# Patient Record
Sex: Male | Born: 1955 | Race: Asian | Hispanic: No | Marital: Married | Smoking: Never smoker
Health system: Southern US, Community
[De-identification: ages and names within clinical notes are randomized; demographics above are authoritative.]

## PROBLEM LIST (undated history)

## (undated) DIAGNOSIS — L509 Urticaria, unspecified: Secondary | ICD-10-CM

## (undated) HISTORY — DX: Urticaria, unspecified: L50.9

---

## 2003-09-27 ENCOUNTER — Encounter: Admission: RE | Admit: 2003-09-27 | Discharge: 2003-12-26 | Payer: Self-pay | Admitting: Family Medicine

## 2004-01-17 ENCOUNTER — Encounter: Admission: RE | Admit: 2004-01-17 | Discharge: 2004-04-16 | Payer: Self-pay | Admitting: Family Medicine

## 2004-11-22 ENCOUNTER — Encounter: Admission: RE | Admit: 2004-11-22 | Discharge: 2004-11-22 | Payer: Self-pay | Admitting: Cardiovascular Disease

## 2010-04-01 ENCOUNTER — Encounter: Admission: RE | Admit: 2010-04-01 | Discharge: 2010-04-01 | Payer: Self-pay | Admitting: Occupational Medicine

## 2016-09-17 ENCOUNTER — Ambulatory Visit: Payer: Self-pay

## 2016-09-17 ENCOUNTER — Other Ambulatory Visit: Payer: Self-pay | Admitting: Occupational Medicine

## 2016-09-17 DIAGNOSIS — M79641 Pain in right hand: Secondary | ICD-10-CM

## 2017-03-12 ENCOUNTER — Other Ambulatory Visit: Payer: Self-pay | Admitting: Occupational Medicine

## 2017-03-12 ENCOUNTER — Ambulatory Visit (INDEPENDENT_AMBULATORY_CARE_PROVIDER_SITE_OTHER): Payer: 59 | Admitting: Allergy & Immunology

## 2017-03-12 ENCOUNTER — Ambulatory Visit: Payer: Self-pay

## 2017-03-12 ENCOUNTER — Encounter: Payer: Self-pay | Admitting: Allergy & Immunology

## 2017-03-12 VITALS — BP 128/66 | HR 64 | Temp 98.0°F | Resp 20 | Ht 65.5 in | Wt 218.4 lb

## 2017-03-12 DIAGNOSIS — J3089 Other allergic rhinitis: Secondary | ICD-10-CM

## 2017-03-12 DIAGNOSIS — L2084 Intrinsic (allergic) eczema: Secondary | ICD-10-CM | POA: Diagnosis not present

## 2017-03-12 DIAGNOSIS — M79641 Pain in right hand: Secondary | ICD-10-CM

## 2017-03-12 DIAGNOSIS — D229 Melanocytic nevi, unspecified: Secondary | ICD-10-CM | POA: Diagnosis not present

## 2017-03-12 MED ORDER — CLOBETASOL PROPIONATE 0.025 % EX CREA: 1.0000 "application " | TOPICAL_CREAM | Freq: Two times a day (BID) | CUTANEOUS | 5 refills | Status: DC | PRN

## 2017-03-12 MED ORDER — MOMETASONE FUROATE 0.1 % EX OINT: TOPICAL_OINTMENT | Freq: Two times a day (BID) | CUTANEOUS | 5 refills | Status: AC | PRN

## 2017-03-12 MED ORDER — FLUTICASONE PROPIONATE 50 MCG/ACT NA SUSP: 2.0000 | Freq: Every day | NASAL | 5 refills | Status: DC

## 2017-03-12 MED ORDER — CRISABOROLE 2 % EX OINT: 1.0000 "application " | TOPICAL_OINTMENT | Freq: Two times a day (BID) | CUTANEOUS | 5 refills | Status: DC | PRN

## 2017-03-12 MED ORDER — CETIRIZINE HCL 10 MG PO TABS: 10.0000 mg | ORAL_TABLET | Freq: Every day | ORAL | 5 refills | Status: DC

## 2017-03-12 MED ORDER — OLOPATADINE HCL 0.2 % OP SOLN: 1.0000 [drp] | OPHTHALMIC | 5 refills | Status: AC

## 2017-03-12 NOTE — Patient Instructions (Addendum)
1. Chronic rhinitis - Testing today showed: positive to [place] blue grass, meadow fescue grass, perennial rye grass, sweet vernal grass, timothy grass, common mugwort (a weed), red cedar (a tree), and dust mites. - Avoidance measures provided. - Start Zyrtec (cetirizine) 10mg  tablet once daily, Flonase (fluticasone) two sprays per nostril daily and Pataday (olopatadine) one drop per eye twice daily as needed - Consider allergy shots as a means of long-term control. - Allergy shots "re-train" the immune system to ignore environmental allergens and decrease the resulting immune response to those allergens.  - We can discuss more at the next appointment if the medications are not working for you.  2. Intrinsic atopic dermatitis - Continue with moisturizing twice daily. - Add clobetasol 0.025% cream twice daily as needed (do not use on the face and do not use longer than two weeks in a row). - Add mometasone 0.1% ointment twice daily as needed (do not use on the face) - Add Eucrisa 2% ointment twice daily (safe to use on the face and the rest of the body). - Consider adding a twice monthly injection in the future to control the eczema: Dupixent  3. Multiple nevi - Continue to follow up with your Dermatologist.  - You have multiple skin lesions that could develop into melanoma.  - Concerning features for melanoma include the ABCDEs: asymmetry (one half is unlike the other), borders (irregular), color (variable), diameter (>74mm), evolving (increase in size over time)  4. Return in about 2 months (around 05/12/2017).  Please inform us of any Emergency Department visits, hospitalizations, or changes in symptoms. Call us before going to the ED for breathing or allergy symptoms since we might be able to fit you in for a sick visit. Feel free to contact us anytime with any questions, problems, or concerns.  It was a pleasure to meet you today! Happy summer!   Websites that have reliable patient  information: 1. American Academy of Asthma, Allergy, and Immunology: www.aaaai.org 2. Food Allergy Research and Education (FARE): foodallergy.org 3. Mothers of Asthmatics: http://www.asthmacommunitynetwork.org 4. American College of Allergy, Asthma, and Immunology: www.acaai.org   ECZEMA SKIN CARE REGIMEN:  Bathed and soak for 10 minutes in warm water once today. Pat dry.  Immediately apply the below creams:  To healthy skin apply Aquaphor, Eucerin, Vanicream, Cerave, or Vaseline jelly twice a day.    To affected areas on the face and neck, apply: Eucrisa 2% ointment twice daily as needed. Be careful to avoid the eyes.   To affected areas on the body (below the face and neck), apply: Mometasone 0.1% ointment 1-2 times a day as needed or Clobetasol cream (0.025%) 1-2 times a day as needed. Do not use clobetasol longer than two weeks in a row. With ointments, be careful to avoid the armpits and groin area.  Control itching with cetirizine (Zyrtec) daily. You can get generic cetirizine at any drug store or on [employer] for much cheaper than the brand name.   Note of any foods make the eczema worse.  Keep finger nails trimmed and filed.   Reducing Pollen Exposure  The American Academy of Allergy, Asthma and Immunology suggests the following steps to reduce your exposure to pollen during allergy seasons.    1. Do not hang sheets or clothing out to dry; pollen may collect on these items. 2. Do not mow lawns or spend time around freshly cut grass; mowing stirs up pollen. 3. Keep windows closed at night.  Keep car windows closed while driving.  4. Minimize morning activities outdoors, a time when pollen counts are usually at their highest. 5. Stay indoors as much as possible when pollen counts or humidity is high and on windy days when pollen tends to remain in the air longer. 6. Use air conditioning when possible.  Many air conditioners have filters that trap the pollen spores. 7. Use a  HEPA room air filter to remove pollen form the indoor air you breathe.  Control of House Dust Mite Allergen    House dust mites play a major role in allergic asthma and rhinitis.  They occur in environments with high humidity wherever human skin, the food for dust mites is found. High levels have been detected in dust obtained from mattresses, pillows, carpets, upholstered furniture, bed covers, clothes and soft toys.  The principal allergen of the house dust mite is found in its feces.  A gram of dust may contain 1,000 mites and 250,000 fecal particles.  Mite antigen is easily measured in the air during house cleaning activities.    1. Encase mattresses, including the box spring, and pillow, in an air tight cover.  Seal the zipper end of the encased mattresses with wide adhesive tape. 2. Wash the bedding in water of 130 degrees Farenheit weekly.  Avoid cotton comforters/quilts and flannel bedding: the most ideal bed covering is the dacron comforter. 3. Remove all upholstered furniture from the bedroom. 4. Remove carpets, carpet padding, rugs, and non-washable window drapes from the bedroom.  Wash drapes weekly or use plastic window coverings. 5. Remove all non-washable stuffed toys from the bedroom.  Wash stuffed toys weekly. 6. Have the room cleaned frequently with a vacuum cleaner and a damp dust-mop.  The patient should not be in a room which is being cleaned and should wait 1 hour after cleaning before going into the room. 7. Close and seal all heating outlets in the bedroom.  Otherwise, the room will become filled with dust-laden air.  An electric heater can be used to heat the room. 8. Reduce indoor humidity to less than 50%.  Do not use a humidifier.

## 2017-03-12 NOTE — Progress Notes (Signed)
NEW PATIENT  Date of Service/Encounter:  03/12/17  Referring provider: Lawerance Cruel, MD   Assessment:   Intrinsic atopic dermatitis  Chronic rhinitis  Multiple nevi   Plan/Recommendations:   1. Chronic rhinitis - Testing today showed: positive to [place] blue grass, meadow fescue grass, perennial rye grass, sweet vernal grass, timothy grass, common mugwort (a weed), red cedar (a tree), and dust mites. - Avoidance measures provided. - Start Zyrtec (cetirizine) 10mg  tablet once daily, Flonase (fluticasone) two sprays per nostril daily and Pataday (olopatadine) one drop per eye twice daily as needed - Consider allergy shots as a means of long-term control. - Allergy shots "re-train" the immune system to ignore environmental allergens and decrease the resulting immune response to those allergens.  - We can discuss more at the next appointment if the medications are not working for you.  2. Intrinsic atopic dermatitis - Continue with moisturizing twice daily. - Add clobetasol 0.025% cream twice daily as needed (do not use on the face and do not use longer than two weeks in a row). - Add mometasone 0.1% ointment twice daily as needed (do not use on the face) - Add Eucrisa 2% ointment twice daily (safe to use on the face and the rest of the body). - Consider adding a twice monthly injection in the future to control the eczema: Dupixent  3. Multiple nevi - Continue to follow up with your Dermatologist.  - You have multiple skin lesions that could develop into melanoma.  - Concerning features for melanoma include the ABCDEs: asymmetry (one half is unlike the other), borders (irregular), color (variable), diameter (>15mm), evolving (increase in size over time)  4. Return in about 2 months (around 05/12/2017).   Subjective:   Randall Stefan is a 61 y.o. male presenting today for evaluation of  Chief Complaint  Patient presents with  . Urticaria    Kyler Poblete has a  history of the following: There are no active problems to display for this patient.   History obtained from: chart review and patient.  Axxel Russom was referred by Lawerance Cruel, MD.     Garyson is a 61 y.o. male presenting for urticaria evaluation. He reports that they get red during particular times of the day including when he is exposed to sun light and when he mows the lawn. He has tried triamcinolone with some improvement. There is concern that this is allergy mediated which is why he was sent over here. He has lesions on his arms (looks more like eczema) as well as areas on his bilateral hands and within his hair line. The worst time of the year is summer. He intermittently uses coconut oil and mustard oil. He will occasionally use Nivea.   He has had problems with the rash for 2-3 years. He has lived in [place] for 19 years. He was fine for 15 years before his symptoms started. He does endorse constant nasal clearing and rhinorrhea. He does take Benadryl intermittently, but no nasal sprays. He tolerates the nasal symptoms but he does not like the skin problem. He does have sneezing in the morning. Symptoms are present throughout the year. He does tolerate all of the major food allergens. He does eat goat as well as chicken and fish.  Asthma  Otherwise, there is no history of other atopic diseases, including , drug allergies, food allergies, stinging insect allergies, or urticaria. There is no significant infectious history. Vaccinations are up to date.    Past  Medical History: There are no active problems to display for this patient.   Medication List:  Allergies as of 03/12/2017   No Known Allergies     Medication List       Accurate as of 03/12/17  9:48 PM. Always use your most recent med list.          glimepiride 4 MG tablet Commonly known as:  AMARYL Take 8 mg by mouth daily.   latanoprost 0.005 % ophthalmic solution Commonly known as:  XALATAN INSTILL  1 DROP INTO EACH EYE NIGHTLY   metFORMIN 850 MG tablet Commonly known as:  GLUCOPHAGE Take 850 mg by mouth 2 (two) times daily.   ONE TOUCH ULTRA TEST test strip Generic drug:  glucose blood See admin instructions.   pioglitazone 45 MG tablet Commonly known as:  ACTOS Take 45 mg by mouth daily.   pravastatin 80 MG tablet Commonly known as:  PRAVACHOL Take 80 mg by mouth daily.   triamcinolone cream 0.1 % Commonly known as:  KENALOG APPLY TO AFFECTED AREA TWICE A DAY FOR 20 DAYS       Birth History: non-contributory.   Developmental History: non-contributory.   Past Surgical History: History reviewed. No pertinent surgical history.   Family History: Family History  Problem Relation Age of Onset  . Allergic rhinitis Neg Hx   . Angioedema Neg Hx   . Asthma Neg Hx   . Eczema Neg Hx   . Immunodeficiency Neg Hx   . Urticaria Neg Hx      Social History: Talin lives at home with his family. They live in a 61 year old home. There is wood in the main living area and area rugs in the bedrooms. They have gas heating and central cooling. There are no animals at home. He does not have dust mite covers on the bedding. There is no tobacco exposure. He works as a Glass blower/designer for 17 years.     Review of Systems: a 14-point review of systems is pertinent for what is mentioned in HPI.  Otherwise, all other systems were negative. Constitutional: negative other than that listed in the HPI Eyes: negative other than that listed in the HPI Ears, nose, mouth, throat, and face: negative other than that listed in the HPI Respiratory: negative other than that listed in the HPI Cardiovascular: negative other than that listed in the HPI Gastrointestinal: he stools 2-3 times per day, somewhat loose, otherwise negative other than that listed in the HPI Genitourinary: negative other than that listed in the HPI Integument: negative other than that listed in the HPI Hematologic:  negative other than that listed in the HPI Musculoskeletal: negative other than that listed in the HPI Neurological: negative other than that listed in the HPI Allergy/Immunologic: negative other than that listed in the HPI    Objective:   Blood pressure 128/66, pulse 64, temperature 98 F (36.7 C), temperature source Oral, resp. rate 20, height 5' 5.5" (1.664 m), weight 218 lb 6.4 oz (99.1 kg), SpO2 95 %. Body mass index is 35.79 kg/m.   Physical Exam:  General: Alert, interactive, in no acute distress. Pleasant obese smiling male.  Eyes: No conjunctival injection present on the right, No conjunctival injection present on the left, PERRL bilaterally, No discharge on the right, No discharge on the left and No Horner-Trantas dots present Ears: Right TM pearly gray with normal light reflex, Left TM pearly gray with normal light reflex, Right TM intact without perforation and Left TM intact without  perforation.  Nose/Throat: External nose within normal limits, nasal crease present and septum midline, turbinates edematous and pale without discharge, post-pharynx mildly erythematous without cobblestoning in the posterior oropharynx. Tonsils 2+ without exudates Neck: Supple without thyromegaly.  Adenopathy: no enlarged lymph nodes appreciated in the anterior cervical, occipital, axillary, epitrochlear, inguinal, or popliteal regions Lungs: Clear to auscultation without wheezing, rhonchi or rales. No increased work of breathing. CV: Normal S1/S2, no murmurs. Capillary refill <2 seconds.  Abdomen: Nondistended, nontender. No guarding or rebound tenderness. Bowel sounds present in all fields and hyperactive  Skin: Dry, erythematous, excoriated patches on the bilateral wrists as well as multiple nevi over his arms and legs. There also dry eczematous patches on the bilaterla face near the ears. Extremities:  No clubbing, cyanosis or edema. Neuro:   Grossly intact. No focal deficits appreciated.  Responsive to questions.  Diagnostic studies:   Allergy Studies:  Indoor/Outdoor Percutaneous Adult Environmental Panel: positive to [place] blue grass, meadow fescue grass, perennial rye grass, sweet vernal grass, timothy grass, common mugwort, red cedar, Df mite and Dp mites. Otherwise negative with adequate controls.  Most Common Foods Panel (peanut, cashew, soy, fish mix, shellfish mix, wheat, milk, egg): negative to the entire panel with adequate controls      Salvatore Marvel, MD [facility]

## 2017-03-13 ENCOUNTER — Telehealth: Payer: Self-pay | Admitting: *Deleted

## 2017-03-13 NOTE — Telephone Encounter (Signed)
[facility] called regarding Clobetasol 0.025 % cream RX sent yesterday.  It does not come in a generic at this strength and will cost $500 per [facility].  0.05% does come in generic.  Per Dr. Hadassah Pais to change Clobetasol from 0.025% cream to 0.05% cream.  [facility] given [facility] to fill RX.

## 2017-11-28 IMAGING — CR DG HAND COMPLETE 3+V*R*
3 series · 3 of 3 positions shown · non-contrast
Comparison: 09/17/2016

CLINICAL DATA: Prior hand trauma with persistent pain at the second
through fourth metacarpals

EXAM:
RIGHT HAND - COMPLETE 3+ VIEW

[view not recorded (1 of 3)]
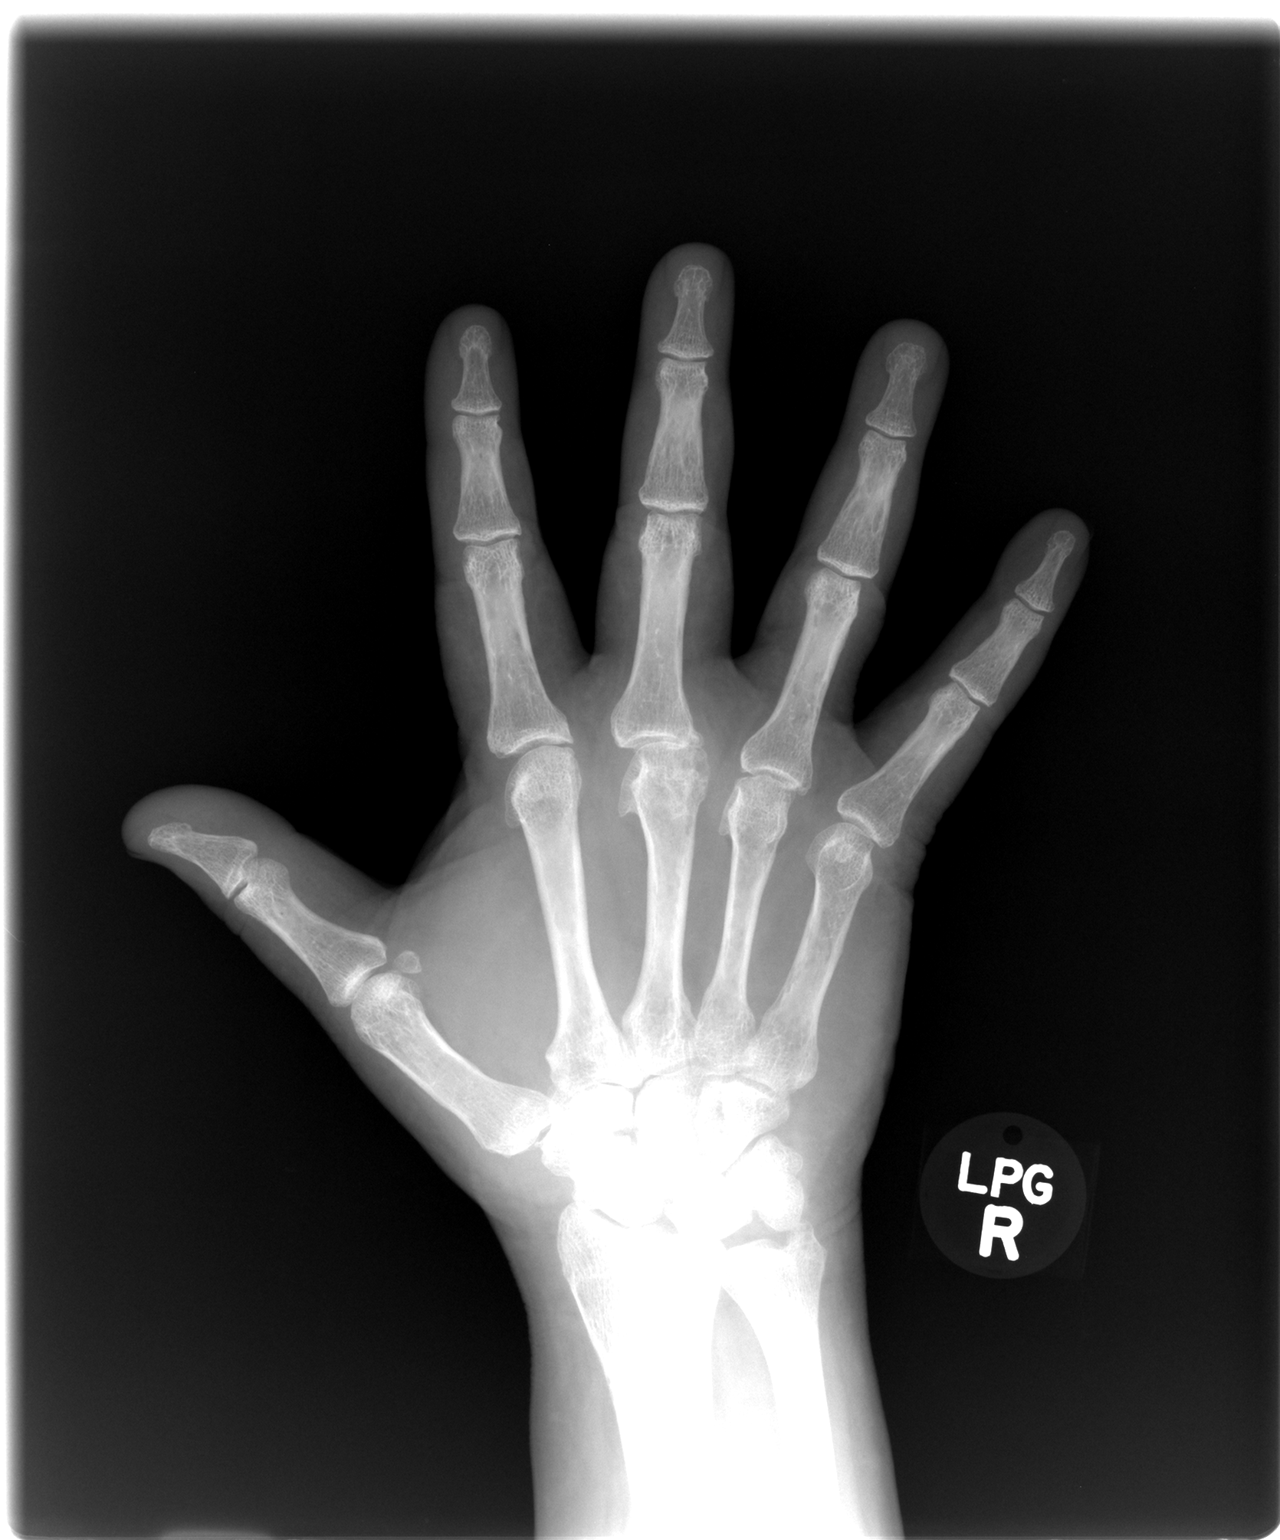

[view not recorded (2 of 3)]
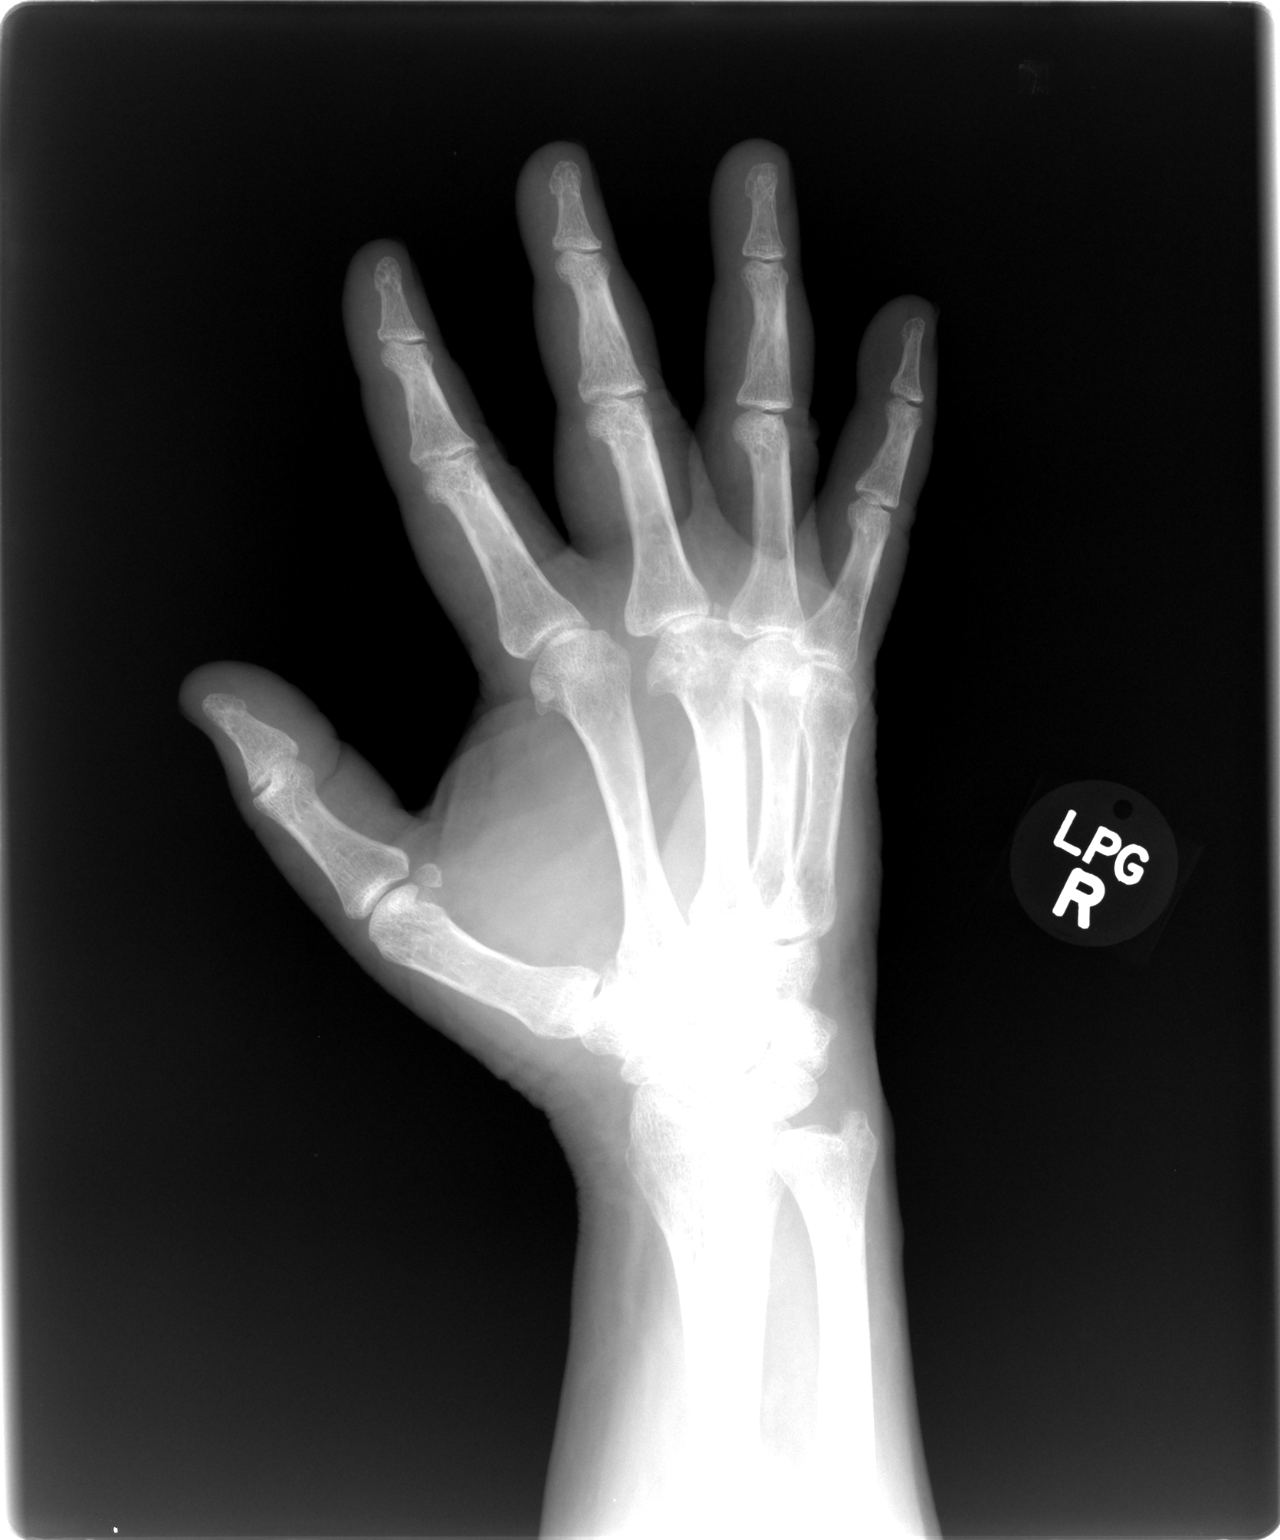

[view not recorded (3 of 3)]
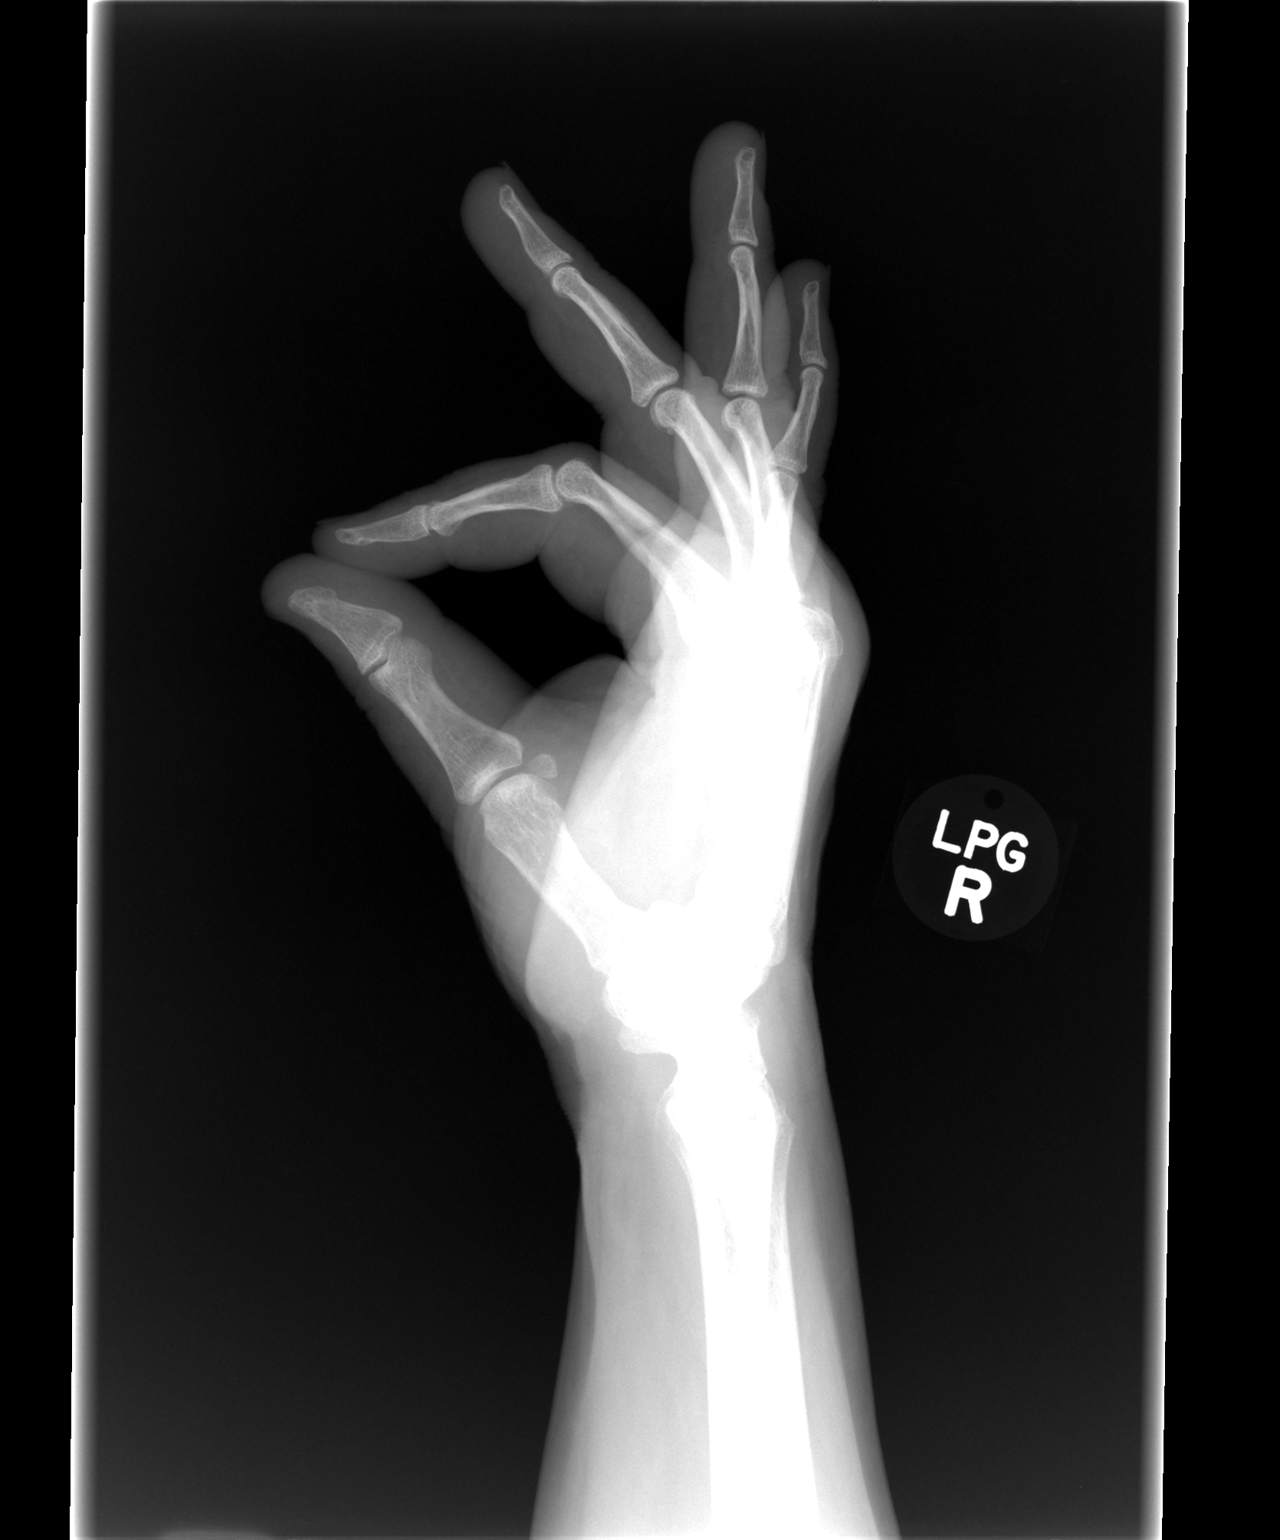

[3 of 3 positions shown; findings below may reference images not displayed]

FINDINGS: No fracture or malalignment. Degenerative changes with narrowing and
small osteophytes at the second through fifth MCP joints. No
radiopaque foreign body.
IMPRESSION: Arthritic changes at the second through fifth MCP joints. No acute
osseous abnormality.

## 2018-08-23 ENCOUNTER — Other Ambulatory Visit: Payer: Self-pay | Admitting: Occupational Medicine

## 2018-08-23 ENCOUNTER — Ambulatory Visit: Payer: Self-pay

## 2018-08-23 DIAGNOSIS — R0781 Pleurodynia: Secondary | ICD-10-CM

## 2019-10-20 ENCOUNTER — Encounter: Payer: Self-pay | Admitting: Podiatry

## 2019-10-20 ENCOUNTER — Ambulatory Visit (INDEPENDENT_AMBULATORY_CARE_PROVIDER_SITE_OTHER): Payer: 59 | Admitting: Podiatry

## 2019-10-20 ENCOUNTER — Other Ambulatory Visit: Payer: Self-pay

## 2019-10-20 ENCOUNTER — Ambulatory Visit (INDEPENDENT_AMBULATORY_CARE_PROVIDER_SITE_OTHER): Payer: 59

## 2019-10-20 DIAGNOSIS — M779 Enthesopathy, unspecified: Secondary | ICD-10-CM

## 2019-10-20 DIAGNOSIS — M79672 Pain in left foot: Secondary | ICD-10-CM

## 2019-10-20 DIAGNOSIS — M25572 Pain in left ankle and joints of left foot: Secondary | ICD-10-CM | POA: Diagnosis not present

## 2019-10-20 MED ORDER — DICLOFENAC SODIUM 75 MG PO TBEC: 75.0000 mg | DELAYED_RELEASE_TABLET | Freq: Two times a day (BID) | ORAL | 2 refills | Status: AC

## 2019-10-20 NOTE — Progress Notes (Signed)
Subjective:   Patient ID: Cephus Slater, male   DOB: 64 y.o.   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  States it started in his forefoot and now has spread to his ankle and is very tender when pressed.  Patient does not smoke likes to be active   Review of Systems  All other systems reviewed and are negative.       Objective:  Physical Exam Vitals and nursing note reviewed.  Constitutional:      Appearance: He is well-developed.  Pulmonary:     Effort: Pulmonary effort is normal.  Musculoskeletal:        General: Normal range of motion.  Skin:    General: Skin is warm.  Neurological:     Mental Status: He is alert.     Neurovascular status intact muscle strength adequate range of motion within normal limits with patient found to have exquisite discomfort second and third metatarsal phalangeal joint left and into the left sinus tarsi.  There is also pain around the ankle and lower leg but it is mild in its intensity with no intense discomfort noted and patient does have no loss currently range of motion subtalar midtarsal joint and     Assessment:  Laboratory capsulitis second and third MPJ left with the third being worse along w sinus tarsitis left with inflammation and possibly other inflammatory points     Plan:  H&P all conditions reviewed and at this point I did go ahead did a forefoot prep and anesthetized with 120 mg like Marcaine mixture first and then aspirated the second and third MPJs getting out a small amount of clear fluid and injected quarter cc dexamethasone Kenalog in each joint and then for the sinus tarsi injected with 3 mg dexamethasone 5 mg Xylocaine advised him to watch his sugar more carefully and applied fascial brace.  Reappoint for [place] to recheck again in the next several weeks and placed on diclofenac 75 mg twice  daily  X-rays were negative for signs of stress fracture arthritis of an advanced nature

## 2019-10-24 ENCOUNTER — Ambulatory Visit: Payer: 59 | Admitting: Podiatry

## 2019-11-03 ENCOUNTER — Other Ambulatory Visit: Payer: Self-pay

## 2019-11-03 ENCOUNTER — Ambulatory Visit (INDEPENDENT_AMBULATORY_CARE_PROVIDER_SITE_OTHER): Payer: 59 | Admitting: Podiatry

## 2019-11-03 ENCOUNTER — Encounter: Payer: Self-pay | Admitting: Podiatry

## 2019-11-03 VITALS — Temp 97.5°F

## 2019-11-03 DIAGNOSIS — M779 Enthesopathy, unspecified: Secondary | ICD-10-CM | POA: Diagnosis not present

## 2019-11-03 DIAGNOSIS — M25572 Pain in left ankle and joints of left foot: Secondary | ICD-10-CM | POA: Diagnosis not present

## 2019-11-04 NOTE — Progress Notes (Signed)
Subjective:   Patient ID: Jonathan Chambers, male   DOB: 64 y.o.   MRN: [id number]   HPI Patient presents stating the brace is helping with patient still having some pain in the ankle.  Patient states that the right foot seems to be doing well and patient is walking well but still is working on cement floors   ROS      Objective:  Physical Exam  Filler status intact negative Homans' sign noted with patient's feet improved from previous treatment with pain mild in intensity and only upon excessive activity     Assessment:  Improved fasciitis-like symptoms and tendinitis capsulitis     Plan:  H&P reviewed conditions recommended continued support shoes rigid bottom shoes and stretching exercises.  Reappoint if symptoms intensified may require more aggressive treatment

## 2019-11-17 ENCOUNTER — Other Ambulatory Visit: Payer: Self-pay | Admitting: Podiatry

## 2019-11-17 DIAGNOSIS — M779 Enthesopathy, unspecified: Secondary | ICD-10-CM

## 2019-12-15 ENCOUNTER — Ambulatory Visit: Payer: Self-pay

## 2019-12-15 ENCOUNTER — Ambulatory Visit: Payer: Self-pay | Attending: Internal Medicine

## 2019-12-15 DIAGNOSIS — Z23 Encounter for immunization: Secondary | ICD-10-CM

## 2019-12-15 NOTE — Progress Notes (Signed)
   Covid-19 Vaccination Clinic  Name:  Jonathan Chambers    MRN: [id number] DOB: November 23, 1955  12/15/2019  [name] was observed post Covid-19 immunization for 15 minutes without incident. He was provided with Vaccine Information Sheet and instruction to access the V-Safe system.   [name] was instructed to call 911 with any severe reactions post vaccine: Marland Kitchen Difficulty breathing  . Swelling of face and throat  . A fast heartbeat  . A bad rash all over body  . Dizziness and weakness   Immunizations Administered    Name Date Dose VIS Date Route   Pfizer COVID-19 Vaccine 12/15/2019  4:13 PM 0.3 mL 09/16/2019 Intramuscular   Manufacturer: [place]   Lot: [id number]   [place]: [id number]

## 2020-01-10 ENCOUNTER — Ambulatory Visit: Payer: Self-pay | Attending: Internal Medicine

## 2020-01-10 DIAGNOSIS — Z23 Encounter for immunization: Secondary | ICD-10-CM

## 2020-01-10 NOTE — Progress Notes (Signed)
   Covid-19 Vaccination Clinic  Name:  Landis Varon    MRN: [id number] DOB: June 23, 1956  01/10/2020  Mr. Babiak was observed post Covid-19 immunization for 15 minutes without incident. He was provided with Vaccine Information Sheet and instruction to access the V-Safe system.   Mr. Avanessian was instructed to call 911 with any severe reactions post vaccine: Marland Kitchen Difficulty breathing  . Swelling of face and throat  . A fast heartbeat  . A bad rash all over body  . Dizziness and weakness   Immunizations Administered    Name Date Dose VIS Date Route   Pfizer COVID-19 Vaccine 01/10/2020  4:38 PM 0.3 mL 09/16/2019 Intramuscular   Manufacturer: [place]   Lot: [id number]   [place]: [id number]

## 2020-02-13 ENCOUNTER — Encounter (INDEPENDENT_AMBULATORY_CARE_PROVIDER_SITE_OTHER): Payer: Self-pay | Admitting: Ophthalmology

## 2020-02-28 ENCOUNTER — Other Ambulatory Visit (INDEPENDENT_AMBULATORY_CARE_PROVIDER_SITE_OTHER): Payer: Self-pay | Admitting: Ophthalmology

## 2020-03-12 ENCOUNTER — Encounter (INDEPENDENT_AMBULATORY_CARE_PROVIDER_SITE_OTHER): Payer: Self-pay | Admitting: Ophthalmology

## 2020-04-10 ENCOUNTER — Ambulatory Visit (INDEPENDENT_AMBULATORY_CARE_PROVIDER_SITE_OTHER): Payer: 59 | Admitting: Ophthalmology

## 2020-04-10 ENCOUNTER — Other Ambulatory Visit: Payer: Self-pay

## 2020-04-10 ENCOUNTER — Encounter (INDEPENDENT_AMBULATORY_CARE_PROVIDER_SITE_OTHER): Payer: Self-pay | Admitting: Ophthalmology

## 2020-04-10 DIAGNOSIS — H3561 Retinal hemorrhage, right eye: Secondary | ICD-10-CM | POA: Diagnosis not present

## 2020-04-10 DIAGNOSIS — E113391 Type 2 diabetes mellitus with moderate nonproliferative diabetic retinopathy without macular edema, right eye: Secondary | ICD-10-CM | POA: Diagnosis not present

## 2020-04-10 DIAGNOSIS — E113312 Type 2 diabetes mellitus with moderate nonproliferative diabetic retinopathy with macular edema, left eye: Secondary | ICD-10-CM

## 2020-04-10 DIAGNOSIS — H43813 Vitreous degeneration, bilateral: Secondary | ICD-10-CM | POA: Diagnosis not present

## 2020-04-10 NOTE — Assessment & Plan Note (Signed)
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 NO  Active progression in either eye.  Will observe

## 2020-04-10 NOTE — Progress Notes (Signed)
04/10/2020     CHIEF COMPLAINT Patient presents for Retina Follow Up   HISTORY OF PRESENT ILLNESS: Jonathan Chambers is a 64 y.o. male who presents to the clinic today for:   HPI    Retina Follow Up    Patient presents with  Diabetic Retinopathy.  In both eyes.  This started 8 months ago.  Severity is moderate.  Duration of 8 months.  Since onset it is stable.          Comments    8 Month Diabetic F/U OU  Pt denies noticeable changes to [place] OU since last visit. Pt denies ocular pain, flashes of light, or floaters OU.  LBS: 98 this AM A1c: 7.0, 01/2020       Last edited by Rockie Neighbours, [place] on 04/10/2020  9:34 AM. (History)      Referring physician: Lawerance Cruel, MD [place],  [place] [address]  HISTORICAL INFORMATION:   Selected notes from the MEDICAL RECORD NUMBER       CURRENT MEDICATIONS: Current Outpatient Medications (Ophthalmic Drugs)  Medication Sig  . latanoprost (XALATAN) 0.005 % ophthalmic solution INSTILL 1 DROP INTO BOTH EYES EVERY DAY AT NIGHT  . Olopatadine HCl (PATADAY) 0.2 % SOLN Place 1 drop into both eyes 1 day or 1 dose.   No current facility-administered medications for this visit. (Ophthalmic Drugs)   Current Outpatient Medications (Other)  Medication Sig  . diclofenac (VOLTAREN) 75 MG EC tablet Take 1 tablet (75 mg total) by mouth 2 (two) times daily.  . empagliflozin (JARDIANCE) 25 MG TABS tablet Jardiance 25 mg tablet  . glimepiride (AMARYL) 4 MG tablet Take 8 mg by mouth daily.  Marland Kitchen loratadine (CLARITIN) 10 MG tablet loratadine 10 mg tablet  . metFORMIN (GLUCOPHAGE) 850 MG tablet Take 850 mg by mouth 2 (two) times daily.  . mometasone (ELOCON) 0.1 % ointment Apply topically 2 (two) times daily as needed.  . ONE TOUCH ULTRA TEST test strip See admin instructions.  . pioglitazone (ACTOS) 45 MG tablet Take 45 mg by mouth daily.  . pravastatin (PRAVACHOL) 80 MG tablet Take 80 mg by mouth daily.  Marland Kitchen triamcinolone cream  (KENALOG) 0.1 % APPLY TO AFFECTED AREA TWICE A DAY FOR 20 DAYS   No current facility-administered medications for this visit. (Other)      REVIEW OF SYSTEMS:    ALLERGIES No Known Allergies  PAST MEDICAL HISTORY Past Medical History:  Diagnosis Date  . Urticaria    History reviewed. No pertinent surgical history.  FAMILY HISTORY Family History  Problem Relation Age of Onset  . Allergic rhinitis Neg Hx   . Angioedema Neg Hx   . Asthma Neg Hx   . Eczema Neg Hx   . Immunodeficiency Neg Hx   . Urticaria Neg Hx     SOCIAL HISTORY Social History   Tobacco Use  . Smoking status: Never Smoker  . Smokeless tobacco: Never Used  Substance Use Topics  . Alcohol use: Not on file  . Drug use: Not on file         OPHTHALMIC EXAM:  Base Eye Exam    Visual Acuity (ETDRS)      Right Left   Dist cc 20/25 +2 20/25 +2   Correction: Glasses       Tonometry (Tonopen, 9:37 AM)      Right Left   Pressure 17 18       Pupils      Pupils Dark  Light Shape React APD   Right PERRL 4 3 Round Brisk None   Left PERRL 4 3 Round Brisk None       Visual Fields (Counting fingers)      Left Right    Full Full       Extraocular Movement      Right Left    Full Full       Neuro/Psych    Oriented x3: Yes   Mood/Affect: Normal       Dilation    Both eyes: 1.0% Mydriacyl, 2.5% Phenylephrine @ 9:37 AM        Slit Lamp and Fundus Exam    External Exam      Right Left   External Normal Normal       Slit Lamp Exam      Right Left   Lids/Lashes Normal Normal   Conjunctiva/Sclera White and quiet White and quiet   Cornea Clear Clear   Anterior Chamber Deep and quiet Deep and quiet   Iris Round and reactive Round and reactive   Lens Centered posterior chamber intraocular lens Centered posterior chamber intraocular lens   Anterior Vitreous Normal Normal       Fundus Exam      Right Left   Posterior Vitreous Posterior vitreous detachment Posterior vitreous  detachment   Disc Normal Normal   C/D Ratio 0.2 0.1   Macula no macular thickening, no exudates, Microaneurysms no macular thickening, no exudates, Microaneurysms   Vessels NPDR- Moderate NPDR- Moderate   Periphery Normal Normal,, incidental myelinated nerve fibers along the inferotemporal arcade, no change.          IMAGING AND PROCEDURES  Imaging and Procedures for 04/10/20  OCT, Retina - OU - Both Eyes       Right Eye Quality was good. Scan locations included subfoveal. Central Foveal Thickness: 281. Progression has been stable.   Left Eye Quality was good. Scan locations included subfoveal. Central Foveal Thickness: 313. Progression has been stable. Findings include normal foveal contour.   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 NO  Active progression in either eye.  Will observe      ICD-10-CM   1. Moderate nonproliferative diabetic retinopathy of right eye without macular edema associated with type 2 diabetes mellitus (HCC)  E11.3391 OCT, Retina - OU - Both Eyes  2. 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 Retinal hemorrhage of right eye  H35.61   4. Posterior vitreous detachment of both eyes  H43.813     1.  2.  3.  Ophthalmic Meds Ordered this visit:  No orders of the defined types were placed in this encounter.      Return in about 9 months (around 01/09/2021) for DILATE OU, OCT.  There are no Patient Instructions on file for this visit.   Explained the diagnoses, plan, and follow up with the patient and they expressed understanding.  Patient expressed understanding of the importance of proper  follow up care.   Clent Demark Shia Delaine M.D. Diseases & Surgery of the Retina and Vitreous Retina & Diabetic [place] 04/10/20  Abbreviations: M myopia (nearsighted); A astigmatism; H hyperopia (farsighted); P presbyopia; Mrx spectacle prescription;  CTL contact lenses; OD right eye; OS left eye; OU both eyes  XT exotropia; ET esotropia; PEK punctate epithelial keratitis; PEE punctate epithelial erosions; DES dry eye syndrome; MGD meibomian gland dysfunction; ATs artificial tears; PFAT's preservative free artificial tears; [place] nuclear sclerotic cataract; PSC posterior subcapsular cataract; ERM epi-retinal membrane; PVD posterior vitreous detachment; RD retinal detachment; DM diabetes mellitus; DR diabetic retinopathy; NPDR non-proliferative diabetic retinopathy; PDR proliferative diabetic retinopathy; CSME clinically significant macular edema; DME diabetic macular edema; dbh dot blot hemorrhages; CWS cotton wool spot; POAG primary open angle glaucoma; C/D cup-to-disc ratio; HVF humphrey visual field; GVF goldmann visual field; OCT optical coherence tomography; IOP intraocular pressure; BRVO Branch retinal vein occlusion; CRVO central retinal vein occlusion; CRAO central retinal artery occlusion; BRAO branch retinal artery occlusion; RT retinal tear; SB scleral buckle; PPV pars plana vitrectomy; VH Vitreous hemorrhage; PRP panretinal laser photocoagulation; IVK intravitreal kenalog; VMT vitreomacular traction; MH Macular hole;  NVD neovascularization of the disc; NVE neovascularization elsewhere; AREDS age related eye disease study; ARMD age related macular degeneration; POAG primary open angle glaucoma; EBMD epithelial/anterior basement membrane dystrophy; ACIOL anterior chamber intraocular lens; IOL intraocular lens; PCIOL posterior chamber intraocular lens; Phaco/IOL phacoemulsification with intraocular lens placement; [place] photorefractive keratectomy; LASIK laser assisted in situ keratomileusis; HTN  hypertension; DM diabetes mellitus; COPD chronic obstructive pulmonary disease

## 2021-01-07 DIAGNOSIS — Z0000 Encounter for general adult medical examination without abnormal findings: Secondary | ICD-10-CM | POA: Diagnosis not present

## 2021-01-07 DIAGNOSIS — E7800 Pure hypercholesterolemia, unspecified: Secondary | ICD-10-CM | POA: Diagnosis not present

## 2021-01-07 DIAGNOSIS — E1169 Type 2 diabetes mellitus with other specified complication: Secondary | ICD-10-CM | POA: Diagnosis not present

## 2021-01-07 DIAGNOSIS — R21 Rash and other nonspecific skin eruption: Secondary | ICD-10-CM | POA: Diagnosis not present

## 2021-01-07 DIAGNOSIS — Z125 Encounter for screening for malignant neoplasm of prostate: Secondary | ICD-10-CM | POA: Diagnosis not present

## 2021-01-10 ENCOUNTER — Encounter (INDEPENDENT_AMBULATORY_CARE_PROVIDER_SITE_OTHER): Payer: Medicare Other | Admitting: Ophthalmology

## 2021-02-03 DIAGNOSIS — Z0000 Encounter for general adult medical examination without abnormal findings: Secondary | ICD-10-CM | POA: Diagnosis not present

## 2021-04-17 DIAGNOSIS — D485 Neoplasm of uncertain behavior of skin: Secondary | ICD-10-CM | POA: Diagnosis not present

## 2021-04-17 DIAGNOSIS — L989 Disorder of the skin and subcutaneous tissue, unspecified: Secondary | ICD-10-CM | POA: Diagnosis not present

## 2021-04-17 DIAGNOSIS — L218 Other seborrheic dermatitis: Secondary | ICD-10-CM | POA: Diagnosis not present

## 2021-04-17 DIAGNOSIS — L820 Inflamed seborrheic keratosis: Secondary | ICD-10-CM | POA: Diagnosis not present

## 2021-04-17 DIAGNOSIS — L309 Dermatitis, unspecified: Secondary | ICD-10-CM | POA: Diagnosis not present

## 2021-06-05 DIAGNOSIS — H04122 Dry eye syndrome of left lacrimal gland: Secondary | ICD-10-CM | POA: Diagnosis not present

## 2021-06-05 DIAGNOSIS — E113293 Type 2 diabetes mellitus with mild nonproliferative diabetic retinopathy without macular edema, bilateral: Secondary | ICD-10-CM | POA: Diagnosis not present

## 2021-06-05 DIAGNOSIS — H31093 Other chorioretinal scars, bilateral: Secondary | ICD-10-CM | POA: Diagnosis not present

## 2021-06-05 DIAGNOSIS — Z961 Presence of intraocular lens: Secondary | ICD-10-CM | POA: Diagnosis not present

## 2021-06-05 DIAGNOSIS — H35071 Retinal telangiectasis, right eye: Secondary | ICD-10-CM | POA: Diagnosis not present

## 2021-07-15 DIAGNOSIS — E113293 Type 2 diabetes mellitus with mild nonproliferative diabetic retinopathy without macular edema, bilateral: Secondary | ICD-10-CM | POA: Diagnosis not present

## 2021-07-15 DIAGNOSIS — H35071 Retinal telangiectasis, right eye: Secondary | ICD-10-CM | POA: Diagnosis not present

## 2021-07-15 DIAGNOSIS — H31093 Other chorioretinal scars, bilateral: Secondary | ICD-10-CM | POA: Diagnosis not present

## 2021-07-15 DIAGNOSIS — Z961 Presence of intraocular lens: Secondary | ICD-10-CM | POA: Diagnosis not present

## 2021-08-05 DIAGNOSIS — L218 Other seborrheic dermatitis: Secondary | ICD-10-CM | POA: Diagnosis not present

## 2021-08-05 DIAGNOSIS — L400 Psoriasis vulgaris: Secondary | ICD-10-CM | POA: Diagnosis not present

## 2021-08-28 DIAGNOSIS — R109 Unspecified abdominal pain: Secondary | ICD-10-CM | POA: Diagnosis not present

## 2021-08-28 DIAGNOSIS — E1169 Type 2 diabetes mellitus with other specified complication: Secondary | ICD-10-CM | POA: Diagnosis not present

## 2021-09-25 DIAGNOSIS — E113293 Type 2 diabetes mellitus with mild nonproliferative diabetic retinopathy without macular edema, bilateral: Secondary | ICD-10-CM | POA: Diagnosis not present

## 2021-09-25 DIAGNOSIS — H31093 Other chorioretinal scars, bilateral: Secondary | ICD-10-CM | POA: Diagnosis not present

## 2021-09-25 DIAGNOSIS — H35071 Retinal telangiectasis, right eye: Secondary | ICD-10-CM | POA: Diagnosis not present

## 2021-09-25 DIAGNOSIS — Z961 Presence of intraocular lens: Secondary | ICD-10-CM | POA: Diagnosis not present

## 2021-09-25 DIAGNOSIS — H3562 Retinal hemorrhage, left eye: Secondary | ICD-10-CM | POA: Diagnosis not present

## 2021-11-25 DIAGNOSIS — Z0000 Encounter for general adult medical examination without abnormal findings: Secondary | ICD-10-CM | POA: Diagnosis not present

## 2021-11-29 DIAGNOSIS — R030 Elevated blood-pressure reading, without diagnosis of hypertension: Secondary | ICD-10-CM | POA: Diagnosis not present

## 2021-11-29 DIAGNOSIS — J302 Other seasonal allergic rhinitis: Secondary | ICD-10-CM | POA: Diagnosis not present

## 2021-11-29 DIAGNOSIS — E1169 Type 2 diabetes mellitus with other specified complication: Secondary | ICD-10-CM | POA: Diagnosis not present

## 2022-02-03 DIAGNOSIS — J3489 Other specified disorders of nose and nasal sinuses: Secondary | ICD-10-CM | POA: Diagnosis not present

## 2022-02-03 DIAGNOSIS — J019 Acute sinusitis, unspecified: Secondary | ICD-10-CM | POA: Diagnosis not present

## 2022-02-03 DIAGNOSIS — R519 Headache, unspecified: Secondary | ICD-10-CM | POA: Diagnosis not present

## 2022-02-03 DIAGNOSIS — R051 Acute cough: Secondary | ICD-10-CM | POA: Diagnosis not present

## 2022-02-20 DIAGNOSIS — R140 Abdominal distension (gaseous): Secondary | ICD-10-CM | POA: Diagnosis not present

## 2022-02-20 DIAGNOSIS — R059 Cough, unspecified: Secondary | ICD-10-CM | POA: Diagnosis not present

## 2022-02-20 DIAGNOSIS — M79672 Pain in left foot: Secondary | ICD-10-CM | POA: Diagnosis not present

## 2022-02-20 DIAGNOSIS — J309 Allergic rhinitis, unspecified: Secondary | ICD-10-CM | POA: Diagnosis not present

## 2022-03-12 ENCOUNTER — Ambulatory Visit (INDEPENDENT_AMBULATORY_CARE_PROVIDER_SITE_OTHER): Payer: Medicare (Managed Care) | Admitting: Podiatry

## 2022-03-12 ENCOUNTER — Encounter: Payer: Self-pay | Admitting: Podiatry

## 2022-03-12 ENCOUNTER — Ambulatory Visit (INDEPENDENT_AMBULATORY_CARE_PROVIDER_SITE_OTHER): Payer: Medicare (Managed Care)

## 2022-03-12 DIAGNOSIS — R52 Pain, unspecified: Secondary | ICD-10-CM

## 2022-03-12 DIAGNOSIS — M21612 Bunion of left foot: Secondary | ICD-10-CM

## 2022-03-12 DIAGNOSIS — M21619 Bunion of unspecified foot: Secondary | ICD-10-CM | POA: Diagnosis not present

## 2022-03-12 DIAGNOSIS — M7672 Peroneal tendinitis, left leg: Secondary | ICD-10-CM | POA: Diagnosis not present

## 2022-03-12 MED ORDER — TRIAMCINOLONE ACETONIDE 10 MG/ML IJ SUSP
10.0000 mg | Freq: Once | INTRAMUSCULAR | Status: AC
  Administered 2022-03-12: 10 mg

## 2022-03-25 DIAGNOSIS — L309 Dermatitis, unspecified: Secondary | ICD-10-CM | POA: Diagnosis not present

## 2022-03-25 DIAGNOSIS — E7800 Pure hypercholesterolemia, unspecified: Secondary | ICD-10-CM | POA: Diagnosis not present

## 2022-03-25 DIAGNOSIS — E1169 Type 2 diabetes mellitus with other specified complication: Secondary | ICD-10-CM | POA: Diagnosis not present

## 2022-03-25 DIAGNOSIS — Z125 Encounter for screening for malignant neoplasm of prostate: Secondary | ICD-10-CM | POA: Diagnosis not present

## 2022-03-25 DIAGNOSIS — Z23 Encounter for immunization: Secondary | ICD-10-CM | POA: Diagnosis not present

## 2022-03-25 DIAGNOSIS — Z0000 Encounter for general adult medical examination without abnormal findings: Secondary | ICD-10-CM | POA: Diagnosis not present

## 2022-03-26 DIAGNOSIS — E113293 Type 2 diabetes mellitus with mild nonproliferative diabetic retinopathy without macular edema, bilateral: Secondary | ICD-10-CM | POA: Diagnosis not present

## 2022-03-26 DIAGNOSIS — H35071 Retinal telangiectasis, right eye: Secondary | ICD-10-CM | POA: Diagnosis not present

## 2022-03-26 DIAGNOSIS — H31093 Other chorioretinal scars, bilateral: Secondary | ICD-10-CM | POA: Diagnosis not present

## 2022-03-26 DIAGNOSIS — H3562 Retinal hemorrhage, left eye: Secondary | ICD-10-CM | POA: Diagnosis not present

## 2022-03-26 DIAGNOSIS — H3582 Retinal ischemia: Secondary | ICD-10-CM | POA: Diagnosis not present

## 2022-05-11 NOTE — Progress Notes (Signed)
Subjective:   Patient ID: Jonathan Chambers, male   DOB: 66 y.o.   MRN: [id number]   HPI Patient presents stating that he has a lot of pain in the outside of his left foot and also was concerned about structural bunion deformity   ROS      Objective:  Physical Exam  Neurovascular status intact with patient found to have inflammation pain on the lateral aspect of the fifth metatarsal with fluid buildup around the peroneal insertion to the base of the fifth metatarsal.  Also noted to have structural bunion deformity with redness around the head     Assessment:  Peroneal tendinitis left at insertion with fluid buildup along with structural bunion deformity     Plan:  H&P reviewed condition and x-ray and at this point advised on injection explaining injection and risk.  I did a sterile prep injected the sheath left 3 mg dexamethasone Kenalog 5 mg Xylocaine I also discussed the bunion deformity and explained to him consideration for correction but do not recommend currently  X-rays indicate there is some enlargement around the fifth metatarsal base no indications of other pathology with structural bunion deformity moderate in its intensity

## 2022-06-05 ENCOUNTER — Encounter: Payer: Self-pay | Admitting: Podiatry

## 2022-06-05 ENCOUNTER — Ambulatory Visit (INDEPENDENT_AMBULATORY_CARE_PROVIDER_SITE_OTHER): Payer: Medicare (Managed Care) | Admitting: Podiatry

## 2022-06-05 DIAGNOSIS — M7672 Peroneal tendinitis, left leg: Secondary | ICD-10-CM

## 2022-06-05 DIAGNOSIS — M109 Gout, unspecified: Secondary | ICD-10-CM

## 2022-06-05 MED ORDER — TRIAMCINOLONE ACETONIDE 10 MG/ML IJ SUSP
10.0000 mg | Freq: Once | INTRAMUSCULAR | Status: AC
  Administered 2022-06-05: 10 mg

## 2022-06-05 MED ORDER — DICLOFENAC SODIUM 75 MG PO TBEC: 75.0000 mg | DELAYED_RELEASE_TABLET | Freq: Two times a day (BID) | ORAL | 2 refills | Status: AC

## 2022-06-05 NOTE — Progress Notes (Signed)
Subjective:   Patient ID: Jonathan Chambers, male   DOB: 66 y.o.   MRN: [id number]   HPI Patient states this area has come back again it was good for about 6 weeks and is sore with swelling   ROS      Objective:  Physical Exam  Neuro vascular status intact with inflammation around the lateral side of the foot around the peroneal tendon slightly more proximal to this area with pain upon palpation     Assessment:  Inflammatory peroneal tendinitis left with inflammation with the possibility of gout due to reoccurrence of inflammatory complex     Plan:  H&P reviewed condition careful steroid injection is slightly more distal direction than previous understanding risk dispensed air fracture walker which he then refused even though I did say it was important with the tendon procedure and injection but he does not want and did not take it with him and I am sending for blood work to rule out gout or other inflammatory condition

## 2022-06-06 DIAGNOSIS — M109 Gout, unspecified: Secondary | ICD-10-CM | POA: Diagnosis not present

## 2022-06-07 LAB — URIC ACID: Uric Acid, Serum: 3.8 mg/dL — ABNORMAL LOW (ref 4.0–8.0)

## 2022-06-19 ENCOUNTER — Ambulatory Visit (INDEPENDENT_AMBULATORY_CARE_PROVIDER_SITE_OTHER): Payer: Medicare (Managed Care) | Admitting: Podiatry

## 2022-06-19 DIAGNOSIS — M109 Gout, unspecified: Secondary | ICD-10-CM | POA: Diagnosis not present

## 2022-06-19 DIAGNOSIS — M7672 Peroneal tendinitis, left leg: Secondary | ICD-10-CM

## 2022-06-21 NOTE — Progress Notes (Signed)
Subjective:   Patient ID: Jonathan Chambers, male   DOB: 66 y.o.   MRN: [id number]   HPI Patient states that the pain has improved and we did get results of blood work indicating uric acid level within normal range.  States he has very mild pain but much better   ROS      Objective:  Physical Exam  Ocular status intact with patient found to have diminishment of discomfort lateral side left foot like pain still noted but much better than it was previously      Assessment:  Probability for inflammatory peroneal tendinitis left that is improved with current possibility for systemic cause but no clear indication      Plan:  H&P condition reviewed and recommended good support shoes ice therapy and if symptoms persist or recur I want to see him back.  Reviewed gout no indication currently

## 2022-07-02 DIAGNOSIS — H04122 Dry eye syndrome of left lacrimal gland: Secondary | ICD-10-CM | POA: Diagnosis not present

## 2022-07-02 DIAGNOSIS — H3582 Retinal ischemia: Secondary | ICD-10-CM | POA: Diagnosis not present

## 2022-07-02 DIAGNOSIS — H3562 Retinal hemorrhage, left eye: Secondary | ICD-10-CM | POA: Diagnosis not present

## 2022-07-02 DIAGNOSIS — E113291 Type 2 diabetes mellitus with mild nonproliferative diabetic retinopathy without macular edema, right eye: Secondary | ICD-10-CM | POA: Diagnosis not present

## 2022-07-02 DIAGNOSIS — Z961 Presence of intraocular lens: Secondary | ICD-10-CM | POA: Diagnosis not present

## 2022-07-02 DIAGNOSIS — E113212 Type 2 diabetes mellitus with mild nonproliferative diabetic retinopathy with macular edema, left eye: Secondary | ICD-10-CM | POA: Diagnosis not present

## 2022-07-02 DIAGNOSIS — H31093 Other chorioretinal scars, bilateral: Secondary | ICD-10-CM | POA: Diagnosis not present

## 2022-07-02 DIAGNOSIS — H35042 Retinal micro-aneurysms, unspecified, left eye: Secondary | ICD-10-CM | POA: Diagnosis not present

## 2022-08-21 DIAGNOSIS — K5900 Constipation, unspecified: Secondary | ICD-10-CM | POA: Diagnosis not present

## 2022-08-21 DIAGNOSIS — Z6831 Body mass index (BMI) 31.0-31.9, adult: Secondary | ICD-10-CM | POA: Diagnosis not present

## 2022-08-21 DIAGNOSIS — R059 Cough, unspecified: Secondary | ICD-10-CM | POA: Diagnosis not present

## 2022-09-08 DIAGNOSIS — E113212 Type 2 diabetes mellitus with mild nonproliferative diabetic retinopathy with macular edema, left eye: Secondary | ICD-10-CM | POA: Diagnosis not present

## 2022-09-23 DIAGNOSIS — R194 Change in bowel habit: Secondary | ICD-10-CM | POA: Diagnosis not present

## 2022-09-23 DIAGNOSIS — R634 Abnormal weight loss: Secondary | ICD-10-CM | POA: Diagnosis not present

## 2022-09-23 DIAGNOSIS — K580 Irritable bowel syndrome with diarrhea: Secondary | ICD-10-CM | POA: Diagnosis not present

## 2022-09-23 DIAGNOSIS — R140 Abdominal distension (gaseous): Secondary | ICD-10-CM | POA: Diagnosis not present

## 2022-09-23 DIAGNOSIS — Z86010 Personal history of colonic polyps: Secondary | ICD-10-CM | POA: Diagnosis not present

## 2022-09-23 DIAGNOSIS — Z1211 Encounter for screening for malignant neoplasm of colon: Secondary | ICD-10-CM | POA: Diagnosis not present

## 2022-09-25 DIAGNOSIS — Z6832 Body mass index (BMI) 32.0-32.9, adult: Secondary | ICD-10-CM | POA: Diagnosis not present

## 2022-09-25 DIAGNOSIS — E1169 Type 2 diabetes mellitus with other specified complication: Secondary | ICD-10-CM | POA: Diagnosis not present

## 2022-10-08 DIAGNOSIS — E113212 Type 2 diabetes mellitus with mild nonproliferative diabetic retinopathy with macular edema, left eye: Secondary | ICD-10-CM | POA: Diagnosis not present

## 2022-10-13 DIAGNOSIS — R059 Cough, unspecified: Secondary | ICD-10-CM | POA: Diagnosis not present

## 2022-10-13 DIAGNOSIS — J209 Acute bronchitis, unspecified: Secondary | ICD-10-CM | POA: Diagnosis not present

## 2022-10-13 DIAGNOSIS — R062 Wheezing: Secondary | ICD-10-CM | POA: Diagnosis not present

## 2022-11-19 DIAGNOSIS — E119 Type 2 diabetes mellitus without complications: Secondary | ICD-10-CM | POA: Diagnosis not present

## 2022-11-19 DIAGNOSIS — Z6831 Body mass index (BMI) 31.0-31.9, adult: Secondary | ICD-10-CM | POA: Diagnosis not present

## 2022-11-19 DIAGNOSIS — E162 Hypoglycemia, unspecified: Secondary | ICD-10-CM | POA: Diagnosis not present

## 2022-11-19 DIAGNOSIS — R059 Cough, unspecified: Secondary | ICD-10-CM | POA: Diagnosis not present

## 2022-11-19 DIAGNOSIS — M79673 Pain in unspecified foot: Secondary | ICD-10-CM | POA: Diagnosis not present

## 2022-11-24 DIAGNOSIS — E113212 Type 2 diabetes mellitus with mild nonproliferative diabetic retinopathy with macular edema, left eye: Secondary | ICD-10-CM | POA: Diagnosis not present

## 2022-12-10 ENCOUNTER — Ambulatory Visit (INDEPENDENT_AMBULATORY_CARE_PROVIDER_SITE_OTHER): Payer: PPO | Admitting: Podiatry

## 2022-12-10 DIAGNOSIS — M7752 Other enthesopathy of left foot: Secondary | ICD-10-CM

## 2022-12-10 MED ORDER — TRIAMCINOLONE ACETONIDE 10 MG/ML IJ SUSP
10.0000 mg | Freq: Once | INTRAMUSCULAR | Status: AC
  Administered 2022-12-10: 10 mg

## 2022-12-10 NOTE — Progress Notes (Signed)
Subjective:   Patient ID: Jonathan Chambers, male   DOB: 67 y.o.   MRN: [id number]   HPI Patient presents stating he has developed pain in the ball of his left foot near the second joint and states that it has been sore and he is also interested in orthotics   ROS      Objective:  Physical Exam  Neurovascular status intact inflammation around the second MPJ left with fluid buildup around the joint surface with  elevation of the second toe     Assessment:  Inflammatory capsulitis second MPJ left with generalized foot pain noted bilateral     Plan:  H&P discussed condition were to focus on the acute inflammation but I do think long-term orthotics would be of benefit to the patient.  I did do a forefoot block I then aspirated the second MPJ again a small amount of clear fluid injected quarter cc dexamethasone Kenalog to reduce pressure reappoint 3 weeks consider orthotics at that time to try to reduce forefoot and arch pain bilateral

## 2022-12-22 DIAGNOSIS — E785 Hyperlipidemia, unspecified: Secondary | ICD-10-CM | POA: Diagnosis not present

## 2022-12-22 DIAGNOSIS — E1165 Type 2 diabetes mellitus with hyperglycemia: Secondary | ICD-10-CM | POA: Diagnosis not present

## 2022-12-22 DIAGNOSIS — E1169 Type 2 diabetes mellitus with other specified complication: Secondary | ICD-10-CM | POA: Diagnosis not present

## 2022-12-22 DIAGNOSIS — E669 Obesity, unspecified: Secondary | ICD-10-CM | POA: Diagnosis not present

## 2022-12-25 ENCOUNTER — Ambulatory Visit: Payer: PPO | Admitting: Podiatry

## 2023-01-05 DIAGNOSIS — R030 Elevated blood-pressure reading, without diagnosis of hypertension: Secondary | ICD-10-CM | POA: Diagnosis not present

## 2023-01-05 DIAGNOSIS — Z6831 Body mass index (BMI) 31.0-31.9, adult: Secondary | ICD-10-CM | POA: Diagnosis not present

## 2023-01-05 DIAGNOSIS — R059 Cough, unspecified: Secondary | ICD-10-CM | POA: Diagnosis not present

## 2023-01-05 DIAGNOSIS — E1169 Type 2 diabetes mellitus with other specified complication: Secondary | ICD-10-CM | POA: Diagnosis not present

## 2023-01-05 DIAGNOSIS — E119 Type 2 diabetes mellitus without complications: Secondary | ICD-10-CM | POA: Diagnosis not present

## 2023-01-07 DIAGNOSIS — K6389 Other specified diseases of intestine: Secondary | ICD-10-CM | POA: Diagnosis not present

## 2023-01-07 DIAGNOSIS — D175 Benign lipomatous neoplasm of intra-abdominal organs: Secondary | ICD-10-CM | POA: Diagnosis not present

## 2023-01-07 DIAGNOSIS — D125 Benign neoplasm of sigmoid colon: Secondary | ICD-10-CM | POA: Diagnosis not present

## 2023-01-07 DIAGNOSIS — Z86010 Personal history of colonic polyps: Secondary | ICD-10-CM | POA: Diagnosis not present

## 2023-01-07 DIAGNOSIS — Z1211 Encounter for screening for malignant neoplasm of colon: Secondary | ICD-10-CM | POA: Diagnosis not present

## 2023-01-07 DIAGNOSIS — D122 Benign neoplasm of ascending colon: Secondary | ICD-10-CM | POA: Diagnosis not present

## 2023-01-07 DIAGNOSIS — K635 Polyp of colon: Secondary | ICD-10-CM | POA: Diagnosis not present

## 2023-01-12 ENCOUNTER — Ambulatory Visit: Payer: PPO | Admitting: Podiatry

## 2023-01-20 DIAGNOSIS — E113212 Type 2 diabetes mellitus with mild nonproliferative diabetic retinopathy with macular edema, left eye: Secondary | ICD-10-CM | POA: Diagnosis not present

## 2023-02-04 ENCOUNTER — Ambulatory Visit: Payer: PPO | Admitting: Podiatry

## 2023-02-25 DIAGNOSIS — E113212 Type 2 diabetes mellitus with mild nonproliferative diabetic retinopathy with macular edema, left eye: Secondary | ICD-10-CM | POA: Diagnosis not present

## 2023-04-06 DIAGNOSIS — E7800 Pure hypercholesterolemia, unspecified: Secondary | ICD-10-CM | POA: Diagnosis not present

## 2023-04-06 DIAGNOSIS — E1169 Type 2 diabetes mellitus with other specified complication: Secondary | ICD-10-CM | POA: Diagnosis not present

## 2023-04-06 DIAGNOSIS — Z125 Encounter for screening for malignant neoplasm of prostate: Secondary | ICD-10-CM | POA: Diagnosis not present

## 2023-04-08 DIAGNOSIS — L309 Dermatitis, unspecified: Secondary | ICD-10-CM | POA: Diagnosis not present

## 2023-04-08 DIAGNOSIS — E119 Type 2 diabetes mellitus without complications: Secondary | ICD-10-CM | POA: Diagnosis not present

## 2023-04-08 DIAGNOSIS — E7800 Pure hypercholesterolemia, unspecified: Secondary | ICD-10-CM | POA: Diagnosis not present

## 2023-04-08 DIAGNOSIS — Z0000 Encounter for general adult medical examination without abnormal findings: Secondary | ICD-10-CM | POA: Diagnosis not present

## 2023-04-08 DIAGNOSIS — R059 Cough, unspecified: Secondary | ICD-10-CM | POA: Diagnosis not present

## 2023-04-08 DIAGNOSIS — Z6831 Body mass index (BMI) 31.0-31.9, adult: Secondary | ICD-10-CM | POA: Diagnosis not present

## 2023-04-16 DIAGNOSIS — E113212 Type 2 diabetes mellitus with mild nonproliferative diabetic retinopathy with macular edema, left eye: Secondary | ICD-10-CM | POA: Diagnosis not present

## 2023-05-29 DIAGNOSIS — E113212 Type 2 diabetes mellitus with mild nonproliferative diabetic retinopathy with macular edema, left eye: Secondary | ICD-10-CM | POA: Diagnosis not present

## 2023-07-14 DIAGNOSIS — E113212 Type 2 diabetes mellitus with mild nonproliferative diabetic retinopathy with macular edema, left eye: Secondary | ICD-10-CM | POA: Diagnosis not present

## 2023-08-25 DIAGNOSIS — E113312 Type 2 diabetes mellitus with moderate nonproliferative diabetic retinopathy with macular edema, left eye: Secondary | ICD-10-CM | POA: Diagnosis not present

## 2024-01-05 DIAGNOSIS — E113312 Type 2 diabetes mellitus with moderate nonproliferative diabetic retinopathy with macular edema, left eye: Secondary | ICD-10-CM | POA: Diagnosis not present

## 2024-02-24 DIAGNOSIS — E113312 Type 2 diabetes mellitus with moderate nonproliferative diabetic retinopathy with macular edema, left eye: Secondary | ICD-10-CM | POA: Diagnosis not present

## 2024-03-16 DIAGNOSIS — R3915 Urgency of urination: Secondary | ICD-10-CM | POA: Diagnosis not present

## 2024-03-16 DIAGNOSIS — E7800 Pure hypercholesterolemia, unspecified: Secondary | ICD-10-CM | POA: Diagnosis not present

## 2024-03-16 DIAGNOSIS — E1169 Type 2 diabetes mellitus with other specified complication: Secondary | ICD-10-CM | POA: Diagnosis not present

## 2024-03-16 DIAGNOSIS — Z0000 Encounter for general adult medical examination without abnormal findings: Secondary | ICD-10-CM | POA: Diagnosis not present

## 2024-03-16 DIAGNOSIS — Z125 Encounter for screening for malignant neoplasm of prostate: Secondary | ICD-10-CM | POA: Diagnosis not present

## 2024-03-16 DIAGNOSIS — Z6831 Body mass index (BMI) 31.0-31.9, adult: Secondary | ICD-10-CM | POA: Diagnosis not present

## 2024-03-16 DIAGNOSIS — B001 Herpesviral vesicular dermatitis: Secondary | ICD-10-CM | POA: Diagnosis not present

## 2024-03-30 DIAGNOSIS — E113312 Type 2 diabetes mellitus with moderate nonproliferative diabetic retinopathy with macular edema, left eye: Secondary | ICD-10-CM | POA: Diagnosis not present

## 2024-04-07 DIAGNOSIS — H401130 Primary open-angle glaucoma, bilateral, stage unspecified: Secondary | ICD-10-CM | POA: Diagnosis not present

## 2024-04-07 DIAGNOSIS — H5203 Hypermetropia, bilateral: Secondary | ICD-10-CM | POA: Diagnosis not present

## 2024-04-07 DIAGNOSIS — H524 Presbyopia: Secondary | ICD-10-CM | POA: Diagnosis not present

## 2024-04-07 DIAGNOSIS — Z961 Presence of intraocular lens: Secondary | ICD-10-CM | POA: Diagnosis not present

## 2024-04-07 DIAGNOSIS — H52223 Regular astigmatism, bilateral: Secondary | ICD-10-CM | POA: Diagnosis not present

## 2024-05-20 DIAGNOSIS — E113312 Type 2 diabetes mellitus with moderate nonproliferative diabetic retinopathy with macular edema, left eye: Secondary | ICD-10-CM | POA: Diagnosis not present

## 2024-06-23 DIAGNOSIS — H524 Presbyopia: Secondary | ICD-10-CM | POA: Diagnosis not present

## 2024-06-23 DIAGNOSIS — H52223 Regular astigmatism, bilateral: Secondary | ICD-10-CM | POA: Diagnosis not present

## 2024-07-15 DIAGNOSIS — E113312 Type 2 diabetes mellitus with moderate nonproliferative diabetic retinopathy with macular edema, left eye: Secondary | ICD-10-CM | POA: Diagnosis not present

## 2024-07-22 DIAGNOSIS — Z23 Encounter for immunization: Secondary | ICD-10-CM | POA: Diagnosis not present

## 2024-07-22 DIAGNOSIS — G479 Sleep disorder, unspecified: Secondary | ICD-10-CM | POA: Diagnosis not present

## 2024-07-22 DIAGNOSIS — R5383 Other fatigue: Secondary | ICD-10-CM | POA: Diagnosis not present

## 2024-07-22 DIAGNOSIS — E1169 Type 2 diabetes mellitus with other specified complication: Secondary | ICD-10-CM | POA: Diagnosis not present

## 2024-07-22 DIAGNOSIS — Z6831 Body mass index (BMI) 31.0-31.9, adult: Secondary | ICD-10-CM | POA: Diagnosis not present

## 2024-08-04 DIAGNOSIS — E113312 Type 2 diabetes mellitus with moderate nonproliferative diabetic retinopathy with macular edema, left eye: Secondary | ICD-10-CM | POA: Diagnosis not present

## 2024-08-15 DIAGNOSIS — H35352 Cystoid macular degeneration, left eye: Secondary | ICD-10-CM | POA: Diagnosis not present

## 2024-08-15 DIAGNOSIS — E113312 Type 2 diabetes mellitus with moderate nonproliferative diabetic retinopathy with macular edema, left eye: Secondary | ICD-10-CM | POA: Diagnosis not present
